# Patient Record
Sex: Female | Born: 1955 | Hispanic: Yes
Health system: Western US, Academic
[De-identification: ages and names within clinical notes are randomized; demographics above are authoritative.]

## PROBLEM LIST (undated history)

## (undated) ENCOUNTER — Encounter (HOSPITAL_COMMUNITY): Admission: RE | Payer: Self-pay | Source: Ambulatory Visit

## (undated) ENCOUNTER — Inpatient Hospital Stay (HOSPITAL_COMMUNITY): Admission: RE | Payer: Self-pay | Source: Ambulatory Visit | Admitting: Hand Surgery

## (undated) SURGERY — ARTHROPLASTY, SHOULDER, TOTAL, REVERSE
Anesthesia: Choice Per Patient on Day of Surgery | Site: Shoulder | Laterality: Right

---

## 2022-08-16 ENCOUNTER — Encounter (HOSPITAL_BASED_OUTPATIENT_CLINIC_OR_DEPARTMENT_OTHER): Payer: Self-pay

## 2022-08-16 DIAGNOSIS — S4291XS Fracture of right shoulder girdle, part unspecified, sequela: Secondary | ICD-10-CM

## 2022-08-23 ENCOUNTER — Encounter (HOSPITAL_BASED_OUTPATIENT_CLINIC_OR_DEPARTMENT_OTHER): Payer: Self-pay | Admitting: Physician Assistant

## 2022-08-23 DIAGNOSIS — M25511 Pain in right shoulder: Secondary | ICD-10-CM

## 2022-08-30 ENCOUNTER — Ambulatory Visit: Payer: 59 | Attending: Physician Assistant | Admitting: Physician Assistant

## 2022-08-30 ENCOUNTER — Encounter (HOSPITAL_BASED_OUTPATIENT_CLINIC_OR_DEPARTMENT_OTHER): Payer: Self-pay | Admitting: Physician Assistant

## 2022-08-30 ENCOUNTER — Ambulatory Visit (HOSPITAL_BASED_OUTPATIENT_CLINIC_OR_DEPARTMENT_OTHER): Admit: 2022-08-30 | Discharge: 2022-08-30 | Disposition: A | Discharge status: Home Health | Payer: 59

## 2022-08-30 VITALS — BP 147/89 | HR 75 | Resp 18 | Ht 66.0 in | Wt 200.0 lb

## 2022-08-30 DIAGNOSIS — M19011 Primary osteoarthritis, right shoulder: Secondary | ICD-10-CM

## 2022-08-30 DIAGNOSIS — M19019 Primary osteoarthritis, unspecified shoulder: Secondary | ICD-10-CM | POA: Insufficient documentation

## 2022-08-30 DIAGNOSIS — M25511 Pain in right shoulder: Secondary | ICD-10-CM | POA: Insufficient documentation

## 2022-08-30 DIAGNOSIS — S4291XS Fracture of right shoulder girdle, part unspecified, sequela: Secondary | ICD-10-CM | POA: Insufficient documentation

## 2022-08-30 NOTE — Progress Notes (Unsigned)
[place] DEPARTMENT OF ORTHOPAEDIC SURGERY    PRIMARY CARE PROVIDER:   Otho Perl      REASON FOR VISIT:  right shoulder    HISTORY: Haley Warren is a 66 year old female who presents today for evaluation of right shoulder pain. They have a past medical history significant for obesity, HTN, DM2, gastroparesis, cirrhosis, RA, and psoriatic arthritis. The patient had a fall approximately three months ago while she was living in [place]. She tripped and impacted her right shoulder. The patient presented to the emergency department where she was diagnosed with a broken tailbone but never told about any acute osseous abnormality of her right shoulder. The patient presented a month later to the emergency department at [facility] where she was told that she had broken her right shoulder. The patient continues to have significant right shoulder pain. She rates her pain as a 10/10. She has been taking ibuprofen which has not been helping with her symptoms. She has not tried any formal physical therapy or injections into her shoulder. She is tearful during our visit secondary to her significant shoulder discomfort and her inability to performed her activities of daily living.     PHYSICAL EXAM:  VITALS: There were no vitals taken for this visit.  GENERAL: A+Ox3. INAD. Well appearing and well groomed. Normal gait  MENTAL STATUS: Pleasant and cooperative. Alert and oriented x3 with normal mood and affect.  Vascular: Pulses are palpable 2+, capillary refills to digits are normal.  Skin: No wounds/lesions/ulcers. Skin warm & dry.  Respiratory: No respiratory distress    MUSCULOSKELETAL:    Shoulder: right   Normal posture.  Shoulder: No swelling.  Range of Motion: Patient was not able to perform any active or passive ROM maneuvers secondary to severe pain. Passive ROM was attempted and significant crepitance was noted.     Sensation intact to light touch in the bilateral upper extremities.  2+ radial  pulses.    ==========    IMAGING FINDINGS: I independently reviewed and interpreted the patient's imaging today.  Xrays: Radiographs of the right shoulder performed today in office demonstrate severe glenohumeral joint osteoarthritis with partial humeral head collapse.     MRI: No MRI performed.     ==========    ASSESSMENT:  Right shoulder severe osteoarthritis  DM2  RA  Obesity  Cirrhosis     PLAN: Haley Warren is a 66 year old female with right shoulder pain after impact injury three months ago. The patient and I discussed conservative versus surgical management of her right shoulder pain. At this time, the patient is not interested in conservative management with an injection and would like to discuss shoulder arthroplasty. I will place a referral for this. In the interim, I have recommended that the patient follow up with her primary care physician for pain management. She will follow up with me on an as needed basis.     -Patient was educated on clinical and imaging findings.  -Patient verbalizes understanding of all discussions today, and all questions were answered satisfactorily.    ===========================    No past medical history on file.  No past surgical history on file.  No current outpatient medications on file.     No current facility-administered medications for this visit.        family history is not on file.  Not on File

## 2022-08-31 ENCOUNTER — Ambulatory Visit (HOSPITAL_BASED_OUTPATIENT_CLINIC_OR_DEPARTMENT_OTHER): Payer: 59 | Admitting: Physician Assistant

## 2022-09-15 ENCOUNTER — Encounter (HOSPITAL_BASED_OUTPATIENT_CLINIC_OR_DEPARTMENT_OTHER): Payer: Self-pay | Admitting: Rheumatology

## 2022-10-14 ENCOUNTER — Ambulatory Visit: Payer: 59 | Attending: Physician Assistant | Admitting: Rheumatology

## 2022-10-14 VITALS — BP 164/83 | HR 64 | Resp 16

## 2022-10-14 DIAGNOSIS — M19212 Secondary osteoarthritis, left shoulder: Secondary | ICD-10-CM | POA: Insufficient documentation

## 2022-10-14 DIAGNOSIS — M19019 Primary osteoarthritis, unspecified shoulder: Secondary | ICD-10-CM | POA: Insufficient documentation

## 2022-10-14 DIAGNOSIS — Z8739 Personal history of other diseases of the musculoskeletal system and connective tissue: Secondary | ICD-10-CM | POA: Insufficient documentation

## 2022-10-14 DIAGNOSIS — M19211 Secondary osteoarthritis, right shoulder: Secondary | ICD-10-CM | POA: Insufficient documentation

## 2022-10-14 NOTE — Progress Notes (Unsigned)
Orthopedics clinic rheumatology follow up for  Haley Warren, Haley Warren    M Irean Hong w/ hx of RA, psoriatic arthritis, SLE, OA, HTN, DM2, gastroparesis, cirrhosis, obesity, fibromyalgia, returns after having last been seen on 10/31 by PA Allyson Sabal.    ASSESSMENT:  Right shoulder severe osteoarthritis  DM2  RA  Obesity  Cirrhosis      PLAN: Haley Warren is a 66 year old female with right shoulder pain after impact injury three months ago. The patient and I discussed conservative versus surgical management of her right shoulder pain. At this time, the patient is not interested in conservative management with an injection and would like to discuss shoulder arthroplasty. I will place a referral for this. In the interim, I have recommended that the patient follow up with her primary care physician for pain management. She will follow up with me on an as needed basis.      -Patient was educated on clinical and imaging findings.  -Patient verbalizes understanding of all discussions today, and all questions were answered satisfactorily.    Today,  Pt continues to have significant pain and limited mobility in the RUE. Pt was supposed to get surgery of L shoulder in [place] but there was issue with her paperwork prior to surgery, and so had to be rescheduled. She was unable to further pursue because of moving to [place]. Since last visit, she has not been using any pain medications due to comorbidities. Has been to ED 2x since last visit. Had recent visit to [facility] ED for pain. Had US-guided CSI to both shoulder in 12/2021 without any benefit. Had been on DMARDs in past but has been multiple years. Pt would like advanced imaging to eval for surgical options.     Allergies   Allergen Reactions    Acetaminophen Other     LIVER CIRRHOSIS    Metformin Other     CHEST PAIN    Ziprasidone Hallucinations       Physical exam    Vitals:    10/14/22 1302   BP: (!) 164/83   BP Location: Left arm   BP Patient Position: Sitting   BP cuff  size: Regular   Pulse: 64   Resp: 16   SpO2: 100%       There is no height or weight on file to calculate BMI.    General: well developed female  In no acute distress  Skin no rashes  Head and neck is unremarkable  R SHOULDER:  INSPECTION: Atrophy of R triceps  PALPATION: TTP anterior and posterior shoulder  ROM:   R shoulder: 10-15 deg flex/ext/abd, limited by severe pain  L shoulder: full AROM    STRENGTH: not tested due to pain      Assessment and plan  #1  R shoulder severe RA  Pt with significant arthritic changes of the glenohumeral joint with significant ROM limitations and pain. Pt has tried and failed lifestyle modifications as well as steroid injections. At this time, pt would benefit from joint replacement with surgery.  - Referral for shoulder replacement    Pt seen by and discussed with attending, Dr. Sydnee Levans.    Arville Lime, MD  [facility]  Resident Physician, PGY-2

## 2022-10-14 NOTE — Progress Notes (Unsigned)
Orthopedics clinic rheumatology evaluation  PCP [name]  Patient seen and examined with Dr Roda Shutters.  Agree with his findings.   Chief complaint: shoulder  [name] is a 66 year old female with shoulder pain    PA Allyson Sabal 10/31    Haley Warren is a 66 year old female who presents today for evaluation of right shoulder pain. They have a past medical history significant for obesity, HTN, DM2, gastroparesis, cirrhosis, RA, and psoriatic arthritis. The patient had a fall approximately three months ago while she was living in [place]. She tripped and impacted her right shoulder. The patient presented to the emergency department where she was diagnosed with a broken tailbone but never told about any acute osseous abnormality of her right shoulder. The patient presented a month later to the emergency department at [facility] where she was told that she had broken her right shoulder. The patient continues to have significant right shoulder pain. She rates her pain as a 10/10. She has been taking ibuprofen which has not been helping with her symptoms. She has not tried any formal physical therapy or injections into her shoulder. She is tearful during our visit secondary to her significant shoulder discomfort and her inability to performed her activities of daily living.      Haley Warren is a 66 year old female with right shoulder pain after impact injury three months ago. The patient and I discussed conservative versus surgical management of her right shoulder pain. At this time, the patient is not interested in conservative management with an injection and would like to discuss shoulder arthroplasty. I will place a referral for this. In the interim, I have recommended that the patient follow up with her primary care physician for pain management. She will follow up with me on an as needed basis.      She has had longstanding problems with her shoulders.  She was scheduled for left  shoulder replacement but was unable to follow through due to moving.  She presently is complaining of more right than left shoulder pain.  It is worse with movements such as reaching up.  She has limited movement.  She is failed to get benefit from corticosteroid injection in the past.    Note she does have a previous history of a diagnosis of rheumatoid arthritis.  Her shoulders were involved.  She has been on methotrexate in the past although other treatment is unclear.  She has not presently on any treatment for rheumatoid arthritis  No past medical history on file.    No past surgical history on file.    There is no problem list on file for this patient.      No current outpatient medications on file.    Not on File    No family history on file.    Social History     Socioeconomic History    Marital status: Widowed     Social Determinants of Health      [place] IPV       Review of systems is negative except as noted in HPI    Physical exam     There were no vitals filed for this visit.    There is no height or weight on file to calculate BMI.    Gen.: Well-developed  in no apparent distress.  Skin shows no rashes.  Head and neck is unremarkable.  Respiration is normal.  No evidence of deformities or inflammatory arthritis in the hands wrists or  elbows  There is significant decreased range of motion in both shoulders  Good range of motion of both knees  No synovitis of the ankles    Laboratory studies:    Imaging:  X-rays with severe glenohumeral osteoarthritis with extensive cystic changes, but no osteophyte formation, some collapse of the humeral head, suggestive of prior rheumatoid arthritis or avascular necrosis                INDINGS:   There is severe glenohumeral joint osteoarthrosis with bone-on-bone articulation and subchondral cystic change. There is likely mild flattening of the humeral head. No definite underlying avascular necrosis is seen to account for the partial collapse of the humeral head. Mild  acromioclavicular joint osteoarthrosis.              Signed by: Lynnae Prude 08/30/2022 15:19:08  IMPRESSION:  IMPRESSION:  Severe glenohumeral joint osteoarthrosis with partial humeral head collapse.  Assessment and plan    #1 Osteoarthritis of the shoulder/history of rheumatoid arthritis.  Overall patient appears to have severe damage in both shoulders, but no evidence of active disease on exam today.     I spent some time reviewing with him the nature of osteoarthritis of the shoulder.    This can happen as a consequence of a genetic predisposition, or previous injury to the shoulder.    We discussed activity modification as the 1st line of treatment.  We discussed limiting overhead activity, and avoiding significant lifting or carrying weight in the affected arm.  We discussed that I do not recommend physical therapy.  I find it more likely to increase the pain because of efforts to improve the range of motion, which unfortunately is not possible because of bony changes to the joint.  Trying to push bone 3 bone usually ends up increasing the pain, and still not improving the range of motion, which is not mechanically possible.  She is failed to get benefit from steroid injection in the past.  We discussed that surgery is not recommended within 3 months of a steroid injection because of increased risk of postoperative infection.  We discussed the possibility of shoulder replacement, either traditional or reverse.  We discussed that the goal of the surgery was to improve pain, not to restore full range of motion.  We discussed that there still would be limitations in overhead activities after surgery.  We discussed the long recovery associated with surgery.  In the setting of previous rheumatoid arthritis, reverse shoulder replacement would be the recommended surgery.    She would like a referral to a surgeon to further discuss the possibility of shoulder replacement        Time spent in pre visit reviewing  records including office notes, labs, and imaging: 10 minutes  Time spent during visit with patent obtaining a history, physical examination, and discussing diagnoses, plan and counseling: 20  minutes  Time spent after visit in orders and note completion   5 minutes    Total time  35  minutes, all done on the day of the visit.    Total duration of encounter spent in the above activies, excluding separately reportable services/proceedures.

## 2022-10-17 ENCOUNTER — Encounter (HOSPITAL_BASED_OUTPATIENT_CLINIC_OR_DEPARTMENT_OTHER): Payer: Self-pay | Admitting: Rheumatology

## 2022-10-18 ENCOUNTER — Encounter (HOSPITAL_BASED_OUTPATIENT_CLINIC_OR_DEPARTMENT_OTHER): Payer: Self-pay | Admitting: Hand Surgery

## 2022-10-18 DIAGNOSIS — M25512 Pain in left shoulder: Secondary | ICD-10-CM

## 2022-11-04 ENCOUNTER — Ambulatory Visit (HOSPITAL_BASED_OUTPATIENT_CLINIC_OR_DEPARTMENT_OTHER): Admit: 2022-11-04 | Discharge: 2022-11-04 | Disposition: A | Discharge status: Home Health | Payer: 59

## 2022-11-04 ENCOUNTER — Telehealth (HOSPITAL_BASED_OUTPATIENT_CLINIC_OR_DEPARTMENT_OTHER): Payer: Self-pay | Admitting: Hand Surgery

## 2022-11-04 ENCOUNTER — Ambulatory Visit: Payer: 59 | Attending: Rheumatology | Admitting: Hand Surgery

## 2022-11-04 DIAGNOSIS — M19211 Secondary osteoarthritis, right shoulder: Secondary | ICD-10-CM | POA: Insufficient documentation

## 2022-11-04 DIAGNOSIS — M19212 Secondary osteoarthritis, left shoulder: Secondary | ICD-10-CM | POA: Insufficient documentation

## 2022-11-04 DIAGNOSIS — M069 Rheumatoid arthritis, unspecified: Secondary | ICD-10-CM | POA: Insufficient documentation

## 2022-11-04 DIAGNOSIS — M19011 Primary osteoarthritis, right shoulder: Secondary | ICD-10-CM | POA: Insufficient documentation

## 2022-11-04 NOTE — Telephone Encounter (Addendum)
Surgery Clearance pending.  Pre operative evaluation letter sent.  See below.      PCP: Erskine Speed, MD Phone: [phone]  Fax: [phone]     3:08 pm & 4:53 pm - Pre Operative evaluation letter sent via fax to PCP on 11/04/22 x 2.

## 2022-11-04 NOTE — Telephone Encounter (Addendum)
2:33 pm 11/04/22 - I left message for patient regarding my name is Inda, surgery coordinator for Dr. Darnell Level at [facility].  I received your surgery order to coordinate surgery.  You will need a medical clearance from your PCP, prior to scheduling surgery with Dr. Darnell Level.  Please call their office to schedule a pre operative evaluation appointment.  I will fax the preoperative evaluation letter to their office(s). I will need the name & phone number to your PCP office.  Once you are informed that you are cleared for surgery, please call me.  As soon as I receive the letter of medical clearance stating your are medically cleared to proceed with surgery, I will call you to schedule your surgery.  Please keep me updated on your medical clearance progress.  If any questions, please call me at phone [phone].  My Office hours are Monday - Friday, 8:00 am - 4:30 pm.

## 2022-11-04 NOTE — Telephone Encounter (Signed)
Reason for call: Incoming call from PCP office requesting to resend the pre operative evaluation letter.   PCP states they have not yet received it.  (CC agent let PCP office know you sent it over at 3:08 pm manually to [phone], which was correct.)    Please refax, manually to [phone]  CC agent tried to refax letter that is in letter tab, not sure if it worked since it has to be sent manually.        Best number to be reached [phone]            Route to RN pool if requesting clinical advice, it can take 24-48 hours turnaround time, route to MA if administrative advice it can take 3-5 business days turn around time.     If an action is needed from the call center please route to P Ortho Scheduling Pool (not directly to cc staff)

## 2022-11-04 NOTE — Telephone Encounter (Addendum)
Surgery Clearance pending.  Pre operative evaluation letter sent.  See below.     PCP: Erskine Speed, MD Phone: [phone]  Fax: [phone]    Pre Operative evaluation letter sent via fax to PCP on 11/04/22.    3:08 pm 11/04/22 - I spoke to office staff at PCP office.  Their fax is currently out of order.  Manual fax to [phone].

## 2022-11-04 NOTE — Telephone Encounter (Signed)
I spoke to patient.  I left you a message on your voicemail.  You will need a medical clearance from your PCP prior to scheduling surgery with Dr. Darnell Level.  Please call their office to schedule a pre operative evaluation appointment.  I will fax the preoperative evaluation letter to their office(s). I will need the name & phone number to your PCP office.  Per Patient, PCP is Dr. Erskine Speed Phone [phone].  Once you are informed that you are cleared for surgery, please call me.  I will follow up with their staff for the medical clearance & studies we are requesting.  As soon as I receive the letter of medical clearance stating your are medically cleared to proceed with surgery, I will call you to schedule your surgery.  Patient also asked about the CT scan, when does it need to be done?  Please call [facility] at Phone [phone] to schedule an appointment for the CT scan.  Please call your PCP office to schedule a pre op appointment.  If any questions, please call me at phone [phone].  My Office hours are Monday - Friday, 8:00 am - 4:30 pm.

## 2022-11-14 NOTE — Progress Notes (Signed)
Haley Warren presents for evaluation of her right shoulder.  She has a history of rheumatoid arthritis.  She fell roughly three months ago while living in [place].  She was seen at [facility] upon her arrival in [place] and was told there was no further treatment available.  Pain is currently 10/10 and is localized to multiple joints.  She has a prior history of rheumatoid arthritis and type 2 diabetes.  She has tried activity modification which has not proven durable.    Prior medical history, surgical history and systems are reviewed   Medications and allergies are reviewed     Examination demonstrates axillary nerve sensation is intact.  She has activation of her deltoid.  She tolerates active abduction to roughly 30.  She has active external rotation to roughly 25.  She has active elbow flexion and extension.  Distal neurovascular exam is intact.    Radiographs demonstrate loss of the articular surface of the right glenohumeral joint.  She has collapse of the humeral head.    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  Surgically her options would be limited to implant arthroplasty.  Given her activity status we would use a reverse total shoulder arthroplasty.  She will need medical clearance prior to surgical management.  We will obtain a CT scan for preoperative planning.  She will contact the office for surgical management.

## 2022-11-17 NOTE — Telephone Encounter (Addendum)
Surgery Clearance pending.  Pre operative evaluation letter sent.  See below.      PCP: Erskine Speed, MD Phone: [phone]  Fax: [phone]     Pre Operative evaluation letter sent via fax to PCP on 11/17/22 & 11/04/22 x 2.    10:11 am 11/17/22 - Patient called & left message.  You have called me & left message.  I have a new phone number that I want to update with you.  It is phone [phone].  My primary is Erskine Speed, MD Phone [phone].  I have a pre op appointment with him on Monday 11/21/22 at 11 am.

## 2022-11-25 NOTE — Telephone Encounter (Signed)
3:11 pm 11/25/22 - Patient called & left message.  I have completed everything. I only need to schedule an appointment for the CT scan.  I wll call to schedule.  Phone [phone].

## 2022-11-30 NOTE — Telephone Encounter (Signed)
9:22 am 11/30/22 - Dr. Roanna Raider Office Phone [phone] Option 1 for English, Option 3 for Medical Records Dept. I left message for medical records dept staff.  My name is Inda, surgery coordinator for Dr. Darnell Level at [facility] Phone [phone] Fax [phone].     I am following up on Patient, [place] DOB: May 02, 1956 medical clearance.  Please fax medical clearance to fax [phone].  I have faxed pre op evaluation letter to your office, twice on 11/04/22 & 11/17/22.  Marland Kitchen  Please call me at phone [phone].      9:31 am 11/30/22 - I left message for patient. My name is Inda, surgery coordinator for Dr. Darnell Level at [facility].  I am returning your call.   I received your message.  I have called & left message with your PCP office, Dr. Roanna Raider to fax me your medical clearance.  As soon as I receive your medical clearance, I will call you to schedule your surgery.  If nay questions, please call me at phone [phone].  My Office hours are Monday - Friday, 8:00 am - 4:30 pm.      2:03 pm 11/29/22 -patient called & left message.  I did all the blood work, Copy with my doctor.  Please call me. Phone [phone]

## 2023-01-05 NOTE — Telephone Encounter (Signed)
Surgery Clearance pending.  Pre operative evaluation letter resent.  See below.      PCP: Erskine Speed, MD Phone: [phone]  Fax: [phone]     Pre Operative evaluation letter sent via fax to PCP on 11/17/22, 11/04/22 x 2 & 01/05/23.

## 2023-04-05 IMAGING — CT CT SHOULDER RT WO CONTRAST
1 series · 12 of 14 positions shown, 15 images · non-contrast
Comparison: None

Images Obtained from [place] [facility]
HISTORY: Pain in right shoulder
TECHNIQUE: Routine 0.8 mm transverse images through the right shoulder were acquired using bone algorithm without the use of intravenous contrast per the vip arthrex protocol. Coronal and sagittal
reconstructions are performed.
Dose reduction technique utilized: Dynamic adjustment of the MA and/or KV based on patient's body weight.
Total nuclear medicine study and CT study count in the prior 12 months: 0

[Series 2: soft tissue · axial · 0.38mm/px · z∈[-1088,-960]mm · 12 of 190 slices shown, 15 images]
[im 15/190  soft-tissue]
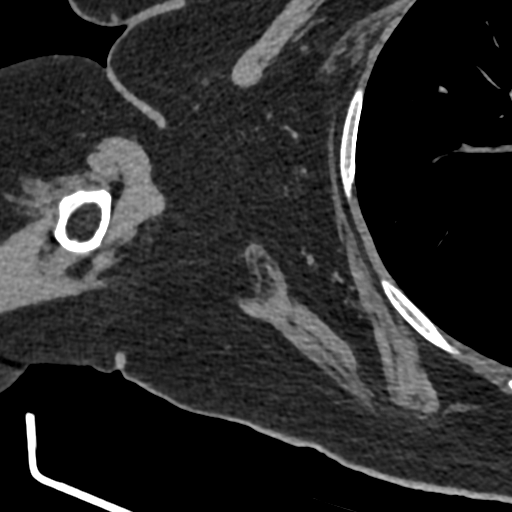
[im 15/190  bone]
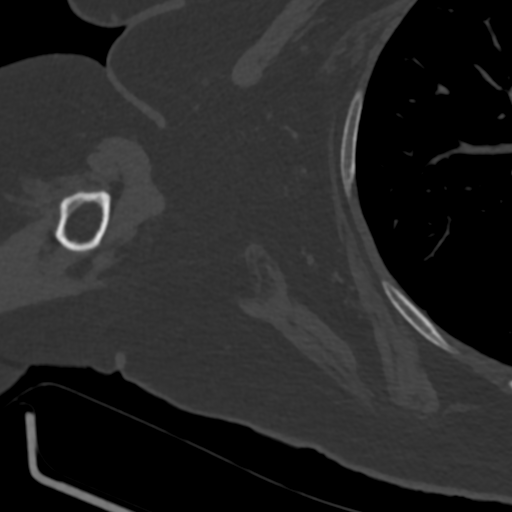
[im 30/190  bone]
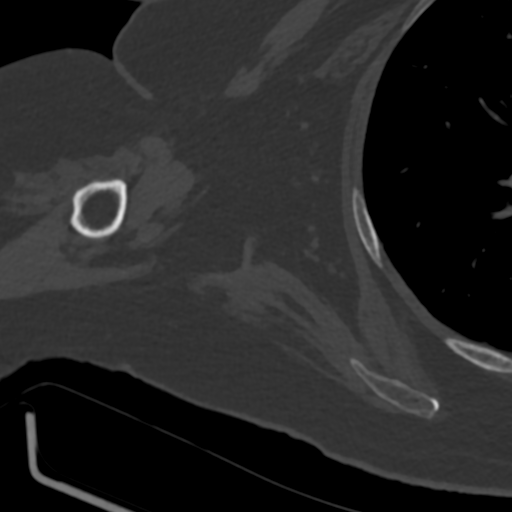
[im 44/190  bone]
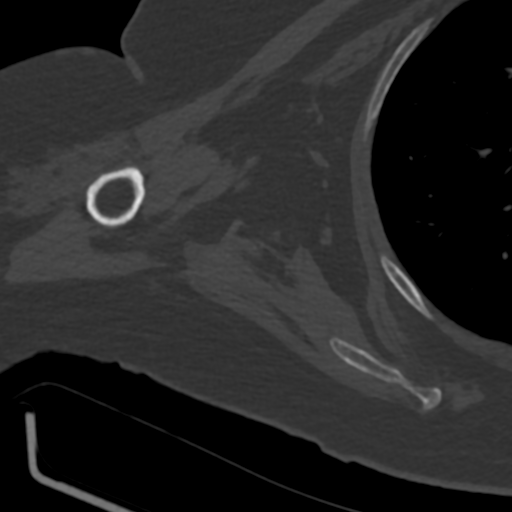
[im 59/190  bone]
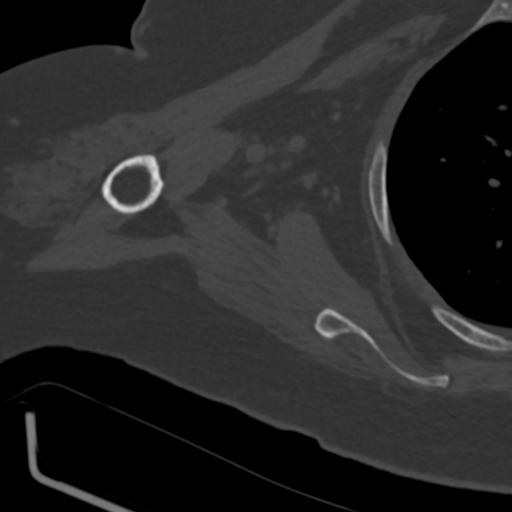
[im 73/190  soft-tissue]
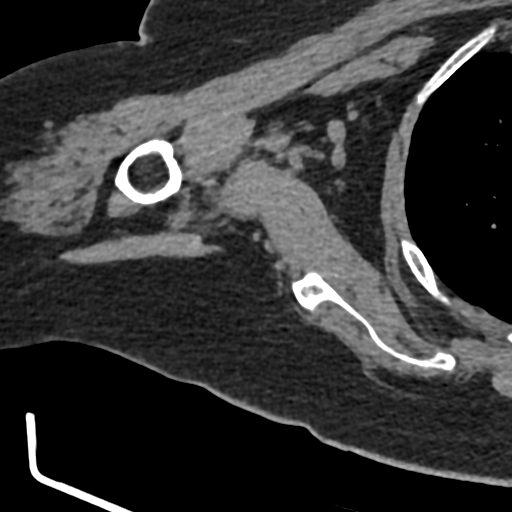
[im 73/190  bone]
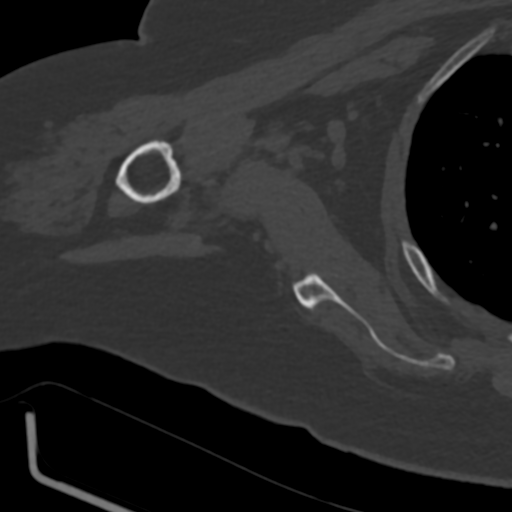
[im 88/190  bone]
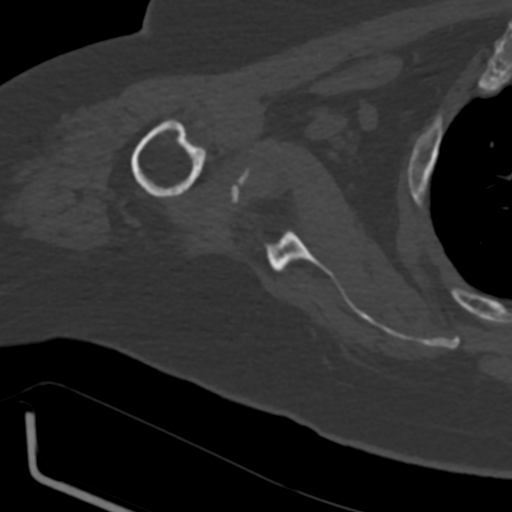
[im 102/190  bone]
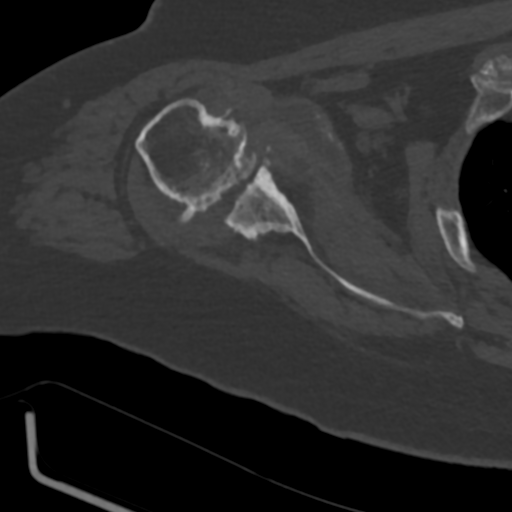
[im 117/190  bone]
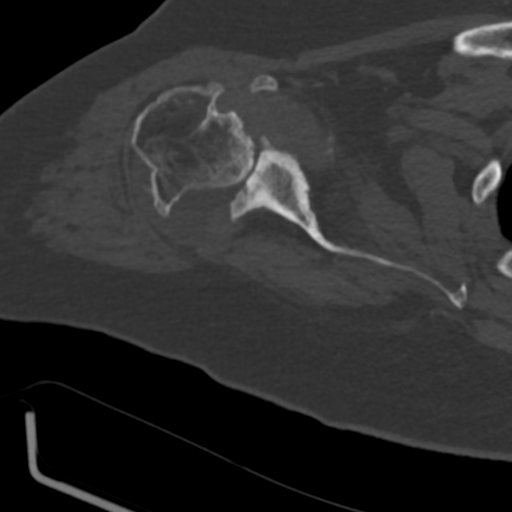
[im 131/190  soft-tissue]
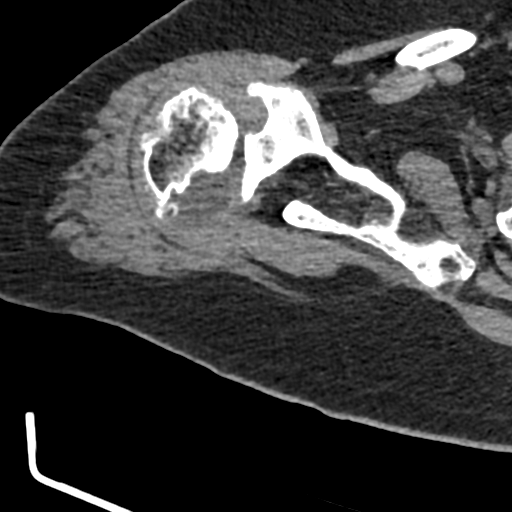
[im 131/190  bone]
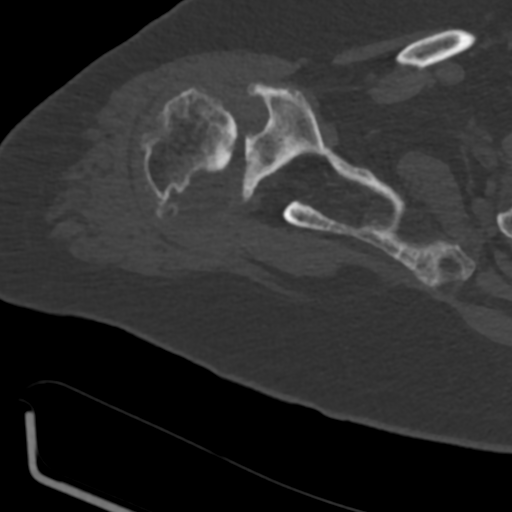
[im 146/190  bone]
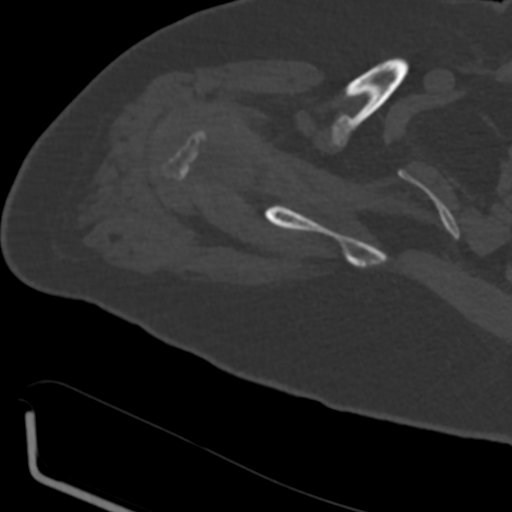
[im 160/190  bone]
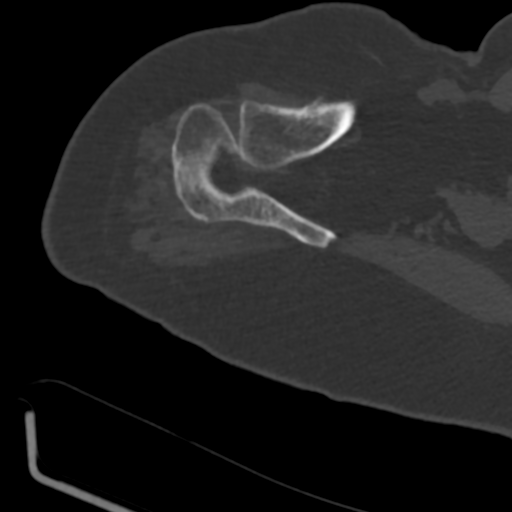
[im 175/190  bone]
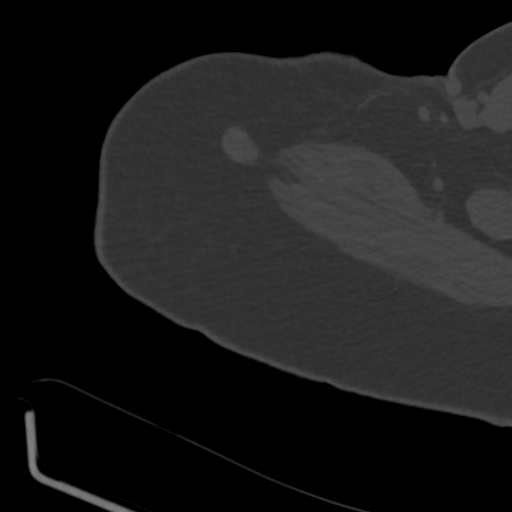

[12 of 14 positions shown; findings below may reference images not displayed]

FINDINGS: Osseous structures appear adequately mineralized. There is no visible acute fracture.
Severe DJD right glenohumeral articulation. There is complete loss of articular cartilage with bone-on-bone apposition subchondral sclerosis. Significant flattening of the posterior superior humeral
head suggesting previous shoulder dislocation/Hill-Sachs fracture. Small remote anterior inferior glenoid fractures suggesting remote Bankart fracture. Large marginal osteophytes involving humeral
head and glenoid moderate joint effusion. Ossified loose bodies are noted within the subscapularis bursa.
Mild acromioclavicular DJD
There is moderate atrophy of the supraspinatus muscle. Mild atrophy of discitis and subscapularis. Teres minor appears unremarkable.
No cystic or solid soft tissue masses. No visible adenopathy.
IMPRESSION: Severe DJD right glenohumeral articulation. Flattening of the posterior superior humeral head suggesting previous shoulder dislocation/Hill-Sachs fracture. Small remote anterior inferior glenoid
fracture. Moderate joint effusion. Ossified loose bodies within the subscapularis bursa. Atrophy of supraspinatus and subscapularis muscles.
Total radiation dose to patient is CTDIvol 22.70 mGy and DLP 413.00 mGy-cm.

## 2023-04-12 IMAGING — MR MRI ANKLE LT WO CONTRAST
6 series · 40 of 40 positions shown · non-contrast
Comparison: None.

Images Obtained from [facility]
INDICATION: Nondisplaced fracture of navicular [scaphoid] of left foot, initial encounter for closed fracture.
TECHNIQUE: Multiplanar, multiecho imaging of the left ankle was performed without contrast, including T1-weighted and fluid sensitive sequences without intravenous contrast.

[Series 3: t1_axial · axial · 3.0mm · 0.50mm/px · z∈[-72,+50]mm · 6 of 35 slices shown]
[im 1/35]
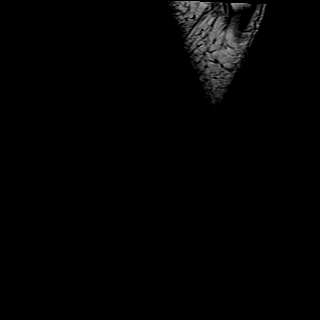
[im 7/35]
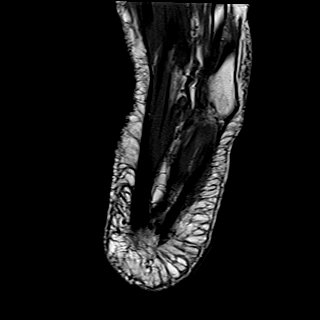
[im 14/35]
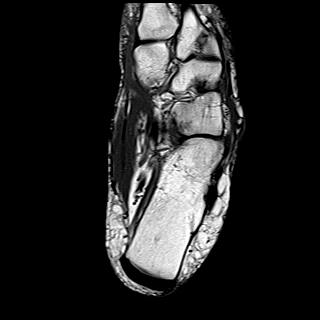
[im 21/35]
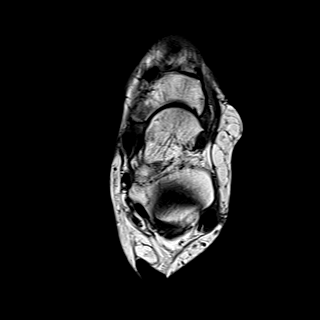
[im 28/35]
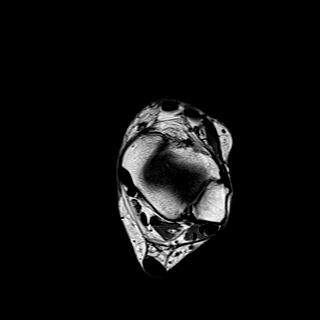
[im 35/35]
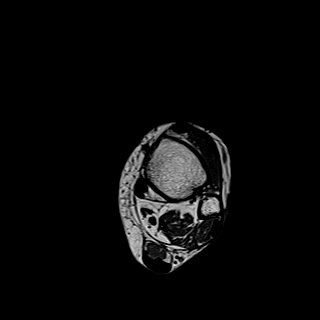

[Series 4: t2_axial_fs · axial · 3.0mm · 0.62mm/px · z∈[-73,+50]mm · 6 of 35 slices shown]
[im 1/35]
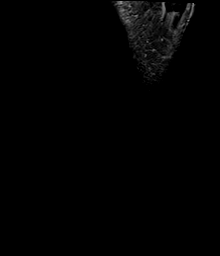
[im 7/35]
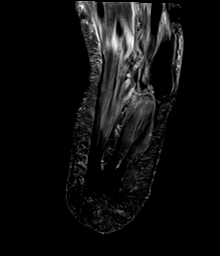
[im 14/35]
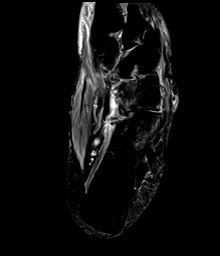
[im 21/35]
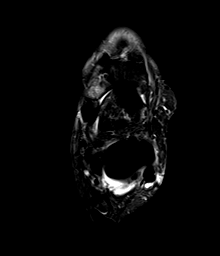
[im 28/35]
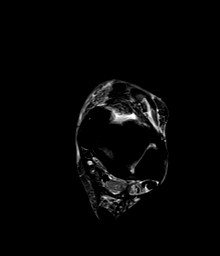
[im 35/35]
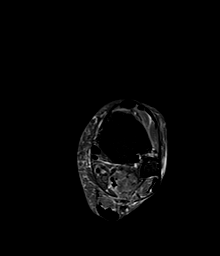

[Series 5: t1_sag · sagittal · 3.0mm · 0.50mm/px · 5 of 24 slices shown]
[im 1/24]
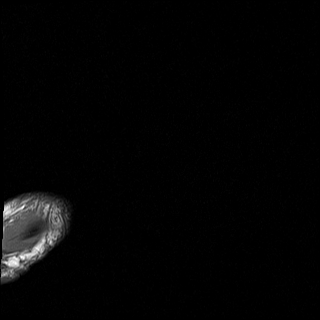
[im 6/24]
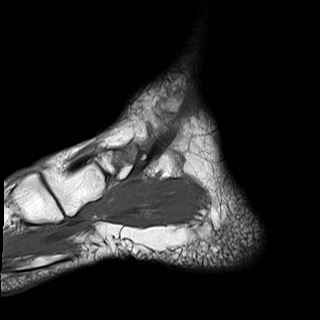
[im 12/24]
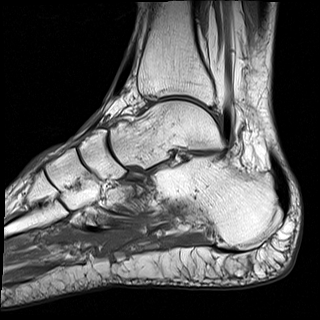
[im 18/24]
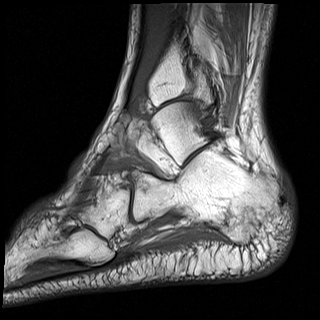
[im 24/24]
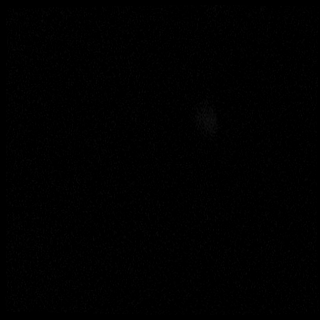

[Series 6: t2_sag_fs · sagittal · 3.0mm · 0.62mm/px · 5 of 24 slices shown]
[im 1/24]
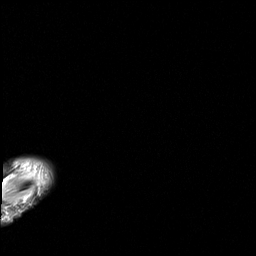
[im 6/24]
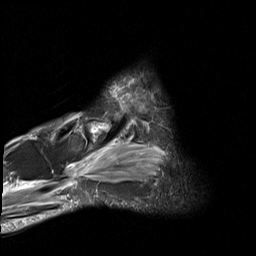
[im 12/24]
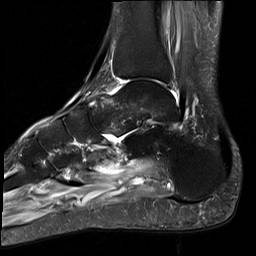
[im 18/24]
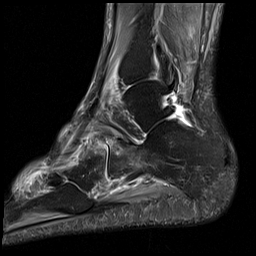
[im 24/24]
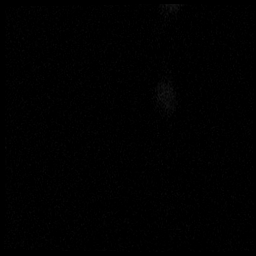

[Series 7: t2_cor_fs · coronal · 3.0mm · 0.62mm/px · 7 of 35 slices shown]
[im 1/35]
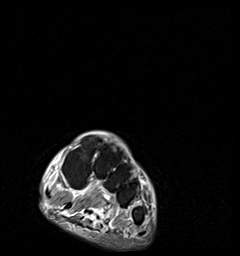
[im 6/35]
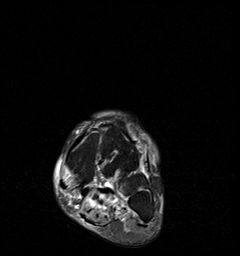
[im 12/35]
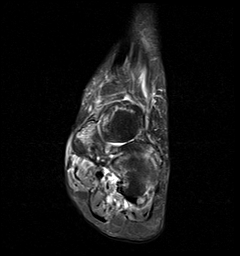
[im 18/35]
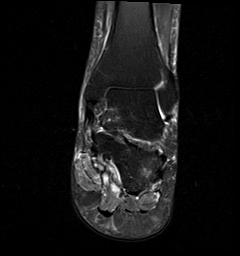
[im 23/35]
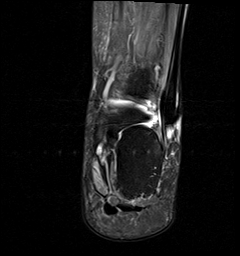
[im 29/35]
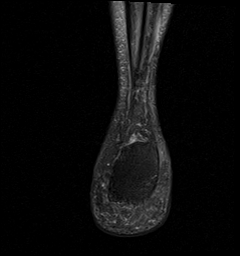
[im 35/35]
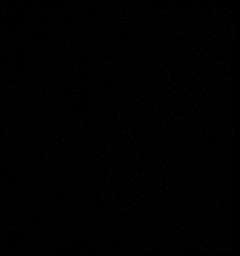

[Series 11: pd_obl_fs · axial · 3.0mm · 0.70mm/px · z∈[-44,+43]mm · 11 of 55 slices shown]
[im 1/55]
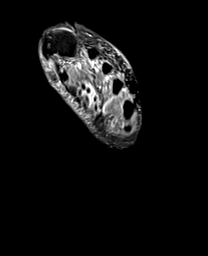
[im 6/55]
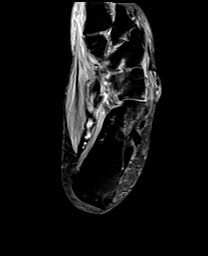
[im 11/55]
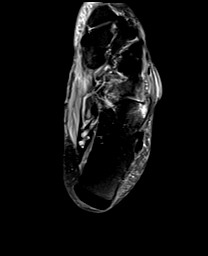
[im 17/55]
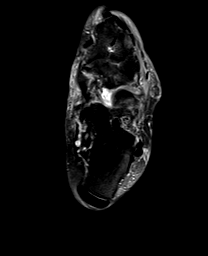
[im 22/55]
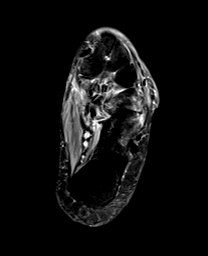
[im 28/55]
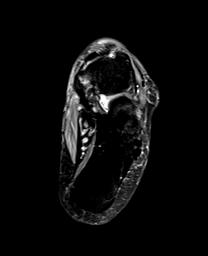
[im 33/55]
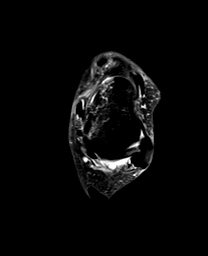
[im 38/55]
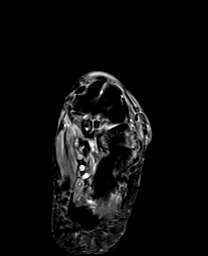
[im 44/55]
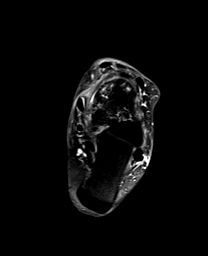
[im 49/55]
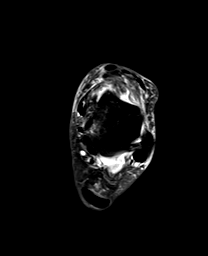
[im 55/55]
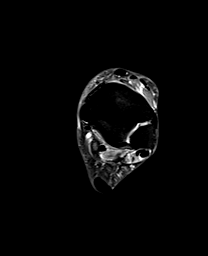

[40 of 40 positions shown; findings below may reference images not displayed]

FINDINGS: LIGAMENTS:
Anterior and posterior tibiofibular ligaments are intact.
Mild scarring of the ATFL and CFL, compatible with old low-grade injuries. Posterior talofibular ligament is intact.
High-grade partial-thickness tear of the deep anterior fibers of the deltoid ligament complex, indeterminate age. Scarring and irregularity of the spring ligament, suggesting chronic injury.
TENDONS:
Achilles: Intact.
Peroneus longus and brevis: Tendons are intact. Edema within the os peroneus which is not fracture.
Posterior tibialis, flexor digitorum longus, and flexor hallucis longus: Intact.
Anterior extensor tendons: Intact.
OTHER:
Sinus tarsi: Normal.
Tarsal tunnel: Normal.
Plantar fascia: Normal.
Marrow and cartilage: Nondisplaced acute fracture at the medial navicular. Bone contusion at the cuboid. Small acute avulsion fracture at the anterior lateral aspect of the calcaneus, at the
attachment of the dorsal calcaneocuboid ligament. Small acute avulsion fracture at the anterior process calcaneus, related to bifurcate ligament injury. Small chronic impaction fracture at the
inferior aspect of the talar head. Small chronic avulsion fracture at the dorsal aspect of the talar head and related to injury of the dorsal talonavicular ligament. Bone contusion of the talar head.
No subluxation of the TMT joints.
Moderate tibiotalar joint effusion. Acute sprain of the dorsal calcaneocuboid ligament. Acute rupture of the bifurcate ligament. Scarring and thickening of the dorsal talonavicular ligament,
compatible with old injury. Lisfranc ligament is intact. Moderate scattered subcutaneous edema about the midfoot. Mild-moderate scattered muscle atrophy throughout the intrinsic muscles of the foot
with corresponding neurogenic edema, suggesting chronic diabetic neuropathy. Small subtalar joint effusion. Question acute strain of the extensor digitorum brevis muscle.
IMPRESSION: 1.  Nondisplaced acute fracture at the medial navicular.
2.  Acute injury of the dorsal calcaneocuboid ligament with small acute avulsion fracture at the anterior lateral aspect of the calcaneus.
3.  Acute bifurcation ligament rupture with small acute avulsion fracture at the anterior process calcaneus.
4.  Question acute strain of the extensor digitorum brevis muscle.
5.  Small chronic impaction fracture at the inferior aspect of the talar head.
6.  Small chronic avulsion fracture at the dorsal aspect of the talar head, related to injury of the dorsal talonavicular ligament.
7.  High-grade partial-thickness tear of the deep anterior fibers of the deltoid ligament complex, indeterminate age.
8.  Scarring and irregularity of the spring ligament, suggesting chronic injury.
9.  Mild-moderate atrophy of the intrinsic muscles of the foot with corresponding neurogenic edema, raising suspicion for chronic diabetic neuropathy.

## 2023-08-24 ENCOUNTER — Other Ambulatory Visit: Payer: Self-pay

## 2023-08-24 NOTE — Telephone Encounter (Signed)
Student Nurse Outreach   [facility] Organization: Clinical Quality Tobacco Screening     S: Haley Warren is a 67 year old who is being outreach via telephone regarding: Tobacco Screening 2024.    B: Mychart messaging not currently enabled for delivery of tobacco use screening via messaging.    A: Unable to leave voicemail as listed number is invalid or voicemail is full.     R: Routing encounter to Professor for review.    Escalation required no

## 2023-08-24 NOTE — Telephone Encounter (Signed)
[facility] RN student documentation reviewed and approved for cosignature.
